# Patient Record
Sex: Male | Born: 1990 | Race: Black or African American | Hispanic: No | Marital: Single | Smoking: Current every day smoker
Health system: Southern US, Community
[De-identification: ages and names within clinical notes are randomized; demographics above are authoritative.]

## PROBLEM LIST (undated history)

## (undated) DIAGNOSIS — F819 Developmental disorder of scholastic skills, unspecified: Secondary | ICD-10-CM

## (undated) DIAGNOSIS — F32A Depression, unspecified: Secondary | ICD-10-CM

## (undated) DIAGNOSIS — F319 Bipolar disorder, unspecified: Secondary | ICD-10-CM

## (undated) DIAGNOSIS — R569 Unspecified convulsions: Secondary | ICD-10-CM

## (undated) DIAGNOSIS — I10 Essential (primary) hypertension: Secondary | ICD-10-CM

## (undated) DIAGNOSIS — R011 Cardiac murmur, unspecified: Secondary | ICD-10-CM

## (undated) DIAGNOSIS — F909 Attention-deficit hyperactivity disorder, unspecified type: Secondary | ICD-10-CM

## (undated) DIAGNOSIS — F329 Major depressive disorder, single episode, unspecified: Secondary | ICD-10-CM

## (undated) HISTORY — PX: HIP FRACTURE SURGERY: SHX118

---
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
## 2010-01-06 ENCOUNTER — Emergency Department (HOSPITAL_COMMUNITY): Admission: EM | Admit: 2010-01-06 | Discharge: 2010-01-07 | Payer: Self-pay | Admitting: Emergency Medicine

## 2010-06-07 ENCOUNTER — Inpatient Hospital Stay (INDEPENDENT_AMBULATORY_CARE_PROVIDER_SITE_OTHER)
Admission: RE | Admit: 2010-06-07 | Discharge: 2010-06-07 | Disposition: A | Discharge status: Home / Self Care | Payer: Self-pay | Source: Ambulatory Visit | Attending: Emergency Medicine | Admitting: Emergency Medicine

## 2010-06-07 DIAGNOSIS — J029 Acute pharyngitis, unspecified: Secondary | ICD-10-CM

## 2010-06-07 DIAGNOSIS — J40 Bronchitis, not specified as acute or chronic: Secondary | ICD-10-CM

## 2010-06-07 LAB — DIFFERENTIAL
Basophils Absolute: 0 10*3/uL (ref 0.0–0.1)
Basophils Relative: 0 % (ref 0–1)
Eosinophils Absolute: 0 10*3/uL (ref 0.0–0.7)
Eosinophils Relative: 0 % (ref 0–5)
Lymphocytes Relative: 10 % — ABNORMAL LOW (ref 12–46)
Lymphs Abs: 1.7 10*3/uL (ref 0.7–4.0)
Monocytes Absolute: 1.5 10*3/uL — ABNORMAL HIGH (ref 0.1–1.0)
Monocytes Relative: 9 % (ref 3–12)
Neutro Abs: 13.1 10*3/uL — ABNORMAL HIGH (ref 1.7–7.7)
Neutrophils Relative %: 81 % — ABNORMAL HIGH (ref 43–77)

## 2010-06-07 LAB — POCT I-STAT, CHEM 8
Chloride: 99 mEq/L (ref 96–112)
HCT: 46 % (ref 39.0–52.0)
Potassium: 3.6 mEq/L (ref 3.5–5.1)
Sodium: 134 mEq/L — ABNORMAL LOW (ref 135–145)

## 2010-06-07 LAB — POCT URINALYSIS DIP (DEVICE)
Glucose, UA: NEGATIVE mg/dL
Nitrite: NEGATIVE

## 2010-06-07 LAB — CBC
HCT: 40.8 % (ref 39.0–52.0)
Hemoglobin: 14 g/dL (ref 13.0–17.0)
MCH: 26.8 pg (ref 26.0–34.0)
MCHC: 34.3 g/dL (ref 30.0–36.0)
MCV: 78 fL (ref 78.0–100.0)
Platelets: 276 10*3/uL (ref 150–400)
RBC: 5.23 MIL/uL (ref 4.22–5.81)
RDW: 13.8 % (ref 11.5–15.5)
WBC: 16.3 10*3/uL — ABNORMAL HIGH (ref 4.0–10.5)

## 2010-06-07 LAB — POCT RAPID STREP A (OFFICE): Streptococcus, Group A Screen (Direct): NEGATIVE

## 2010-06-16 LAB — RAPID STREP SCREEN (MED CTR MEBANE ONLY): Streptococcus, Group A Screen (Direct): NEGATIVE

## 2011-08-15 ENCOUNTER — Encounter (HOSPITAL_COMMUNITY): Payer: Self-pay

## 2011-08-15 ENCOUNTER — Emergency Department (HOSPITAL_COMMUNITY)
Admission: EM | Admit: 2011-08-15 | Discharge: 2011-08-16 | Payer: Self-pay | Attending: Emergency Medicine | Admitting: Emergency Medicine

## 2011-08-15 ENCOUNTER — Emergency Department (HOSPITAL_COMMUNITY): Payer: Self-pay

## 2011-08-15 DIAGNOSIS — S0003XA Contusion of scalp, initial encounter: Secondary | ICD-10-CM | POA: Insufficient documentation

## 2011-08-15 DIAGNOSIS — J320 Chronic maxillary sinusitis: Secondary | ICD-10-CM | POA: Insufficient documentation

## 2011-08-15 DIAGNOSIS — F17200 Nicotine dependence, unspecified, uncomplicated: Secondary | ICD-10-CM | POA: Insufficient documentation

## 2011-08-15 DIAGNOSIS — R296 Repeated falls: Secondary | ICD-10-CM | POA: Insufficient documentation

## 2011-08-15 DIAGNOSIS — F445 Conversion disorder with seizures or convulsions: Secondary | ICD-10-CM

## 2011-08-15 DIAGNOSIS — Z79899 Other long term (current) drug therapy: Secondary | ICD-10-CM | POA: Insufficient documentation

## 2011-08-15 DIAGNOSIS — J322 Chronic ethmoidal sinusitis: Secondary | ICD-10-CM | POA: Insufficient documentation

## 2011-08-15 DIAGNOSIS — R569 Unspecified convulsions: Secondary | ICD-10-CM | POA: Insufficient documentation

## 2011-08-15 HISTORY — DX: Attention-deficit hyperactivity disorder, unspecified type: F90.9

## 2011-08-15 HISTORY — DX: Unspecified convulsions: R56.9

## 2011-08-15 HISTORY — DX: Developmental disorder of scholastic skills, unspecified: F81.9

## 2011-08-15 HISTORY — DX: Bipolar disorder, unspecified: F31.9

## 2011-08-15 HISTORY — DX: Cardiac murmur, unspecified: R01.1

## 2011-08-15 HISTORY — DX: Essential (primary) hypertension: I10

## 2011-08-15 HISTORY — DX: Depression, unspecified: F32.A

## 2011-08-15 HISTORY — DX: Major depressive disorder, single episode, unspecified: F32.9

## 2011-08-15 LAB — RAPID URINE DRUG SCREEN, HOSP PERFORMED
Amphetamines: NOT DETECTED
Benzodiazepines: NOT DETECTED
Opiates: NOT DETECTED

## 2011-08-15 LAB — POCT I-STAT, CHEM 8
BUN: 6 mg/dL (ref 6–23)
Creatinine, Ser: 0.9 mg/dL (ref 0.50–1.35)
Glucose, Bld: 84 mg/dL (ref 70–99)
Hemoglobin: 17.3 g/dL — ABNORMAL HIGH (ref 13.0–17.0)
Potassium: 3.6 mEq/L (ref 3.5–5.1)

## 2011-08-15 LAB — PHENYTOIN LEVEL, TOTAL: Phenytoin Lvl: <2.5 ug/mL — ABNORMAL LOW (ref 10.0–20.0)

## 2011-08-15 MED ORDER — LORAZEPAM 2 MG/ML IJ SOLN
INTRAMUSCULAR | Status: AC
  Administered 2011-08-15: 2 mg via INTRAVENOUS
  Filled 2011-08-15: qty 1

## 2011-08-15 MED ORDER — LORAZEPAM 2 MG/ML IJ SOLN
INTRAMUSCULAR | Status: AC
  Filled 2011-08-15: qty 2

## 2011-08-15 MED ORDER — SODIUM CHLORIDE 0.9 % IV SOLN
INTRAVENOUS | Status: DC
  Administered 2011-08-15 (×2): via INTRAVENOUS

## 2011-08-15 MED ORDER — LORAZEPAM 2 MG/ML IJ SOLN
INTRAMUSCULAR | Status: AC
  Filled 2011-08-15: qty 1

## 2011-08-15 MED ORDER — LORAZEPAM 2 MG/ML IJ SOLN
INTRAMUSCULAR | Status: AC
  Administered 2011-08-15: 22:00:00
  Filled 2011-08-15: qty 1

## 2011-08-15 MED ORDER — SODIUM CHLORIDE 0.9 % IV SOLN
20.0000 mg/kg | Freq: Once | INTRAVENOUS | Status: AC
  Administered 2011-08-16: 2160 mg via INTRAVENOUS
  Filled 2011-08-15: qty 43.2

## 2011-08-15 MED ORDER — LORAZEPAM 2 MG/ML IJ SOLN
2.0000 mg | Freq: Once | INTRAMUSCULAR | Status: AC
  Administered 2011-08-15: 22:00:00 via INTRAVENOUS
  Administered 2011-08-15 (×2): 2 mg via INTRAVENOUS

## 2011-08-15 NOTE — ED Notes (Signed)
Pt back from CT and having seizure.  Ativan given IV.  Pt arousing at this time.

## 2011-08-15 NOTE — Progress Notes (Addendum)
Patient ID: Dustin Fleming, male   DOB: Jan 17, 1991, 21 y.o.   MRN: [id number] NEUROLOGY GENERAL CONSULT NOTE  Referring Physician:  Rubin Payor  HPI:  Mr. Dustin Fleming is a 21 y.o. male with history of childhood seizures previous followed by Dr. Sharene Skeans who presents to the ED from jail with recurrent seizure-like episodes today. Patient reports he began having staring spells at 21 y/o and was initially on Depakote and Lamictal for several years. He followed locally with Dr. Sharene Skeans. In 10/2010, he was incarcerated and switched to Dilantin because he says the jail didn't have his other meds. Up to this point, he was having about 1 seizure per year which he says had become GTC episodes. After switching to Dilantin, he was released from jail around December and was no longer able to afford his meds for several months. He says he was having a few seizures per month off medication. He went back to jail yesterday. Today, he has had 7-8 episodes of generalized thrashing at jail and received 4mg  IV ativan and 4mg  IV versed. Here he has had 5 episodes and received multiple doses of ativan. I witnessed one of these spells. He complained of smelling rain before hand and then began thrashing in the bed, banging his head into his pillow and throwing his arms up and down. This lasted about 30 seconds. He then lay with his eyes open for about 20 seconds and then returned to baseline and began talking with me like normal. CT head was negative. Dilantin level was undetectable.   Past Medical History  Diagnosis Date  . Seizures   . Bipolar 1 disorder   . Depression   . Hypertension   . ADHD (attention deficit hyperactivity disorder)   . Learning disability   . Murmur     Past Surgical History  Procedure Date  . Hip fracture surgery     bilateral   Allergies  Allergen Reactions  . Dust Mite Extract   . Mold Extract (Trichophyton)     (Not in a hospital admission) History reviewed. No pertinent family  history. Social Hx: Smokes occasionally. Denies EtOH. UDS positive for marijuana.  Review of Systems:  A ten system review of systems was obtained and was negative except as stated above.   Physical Exam: BP 137/75  Pulse 96  Temp 98.8 F (37.1 C)  Resp 32  SpO2 100% GENERAL:   Well nourished, well hydrated, no acute distress.   CARDIOVASCULAR:   Regular rate and rhythm, no thrills or palpable murmurs, S1, S2, no murmur, no rubs or gallops.      Carotid arteries: No carotid bruits.   RESPIRATORY:  Clear to auscultation bilaterally, no wheezes, rhonci or rales  ABDOMEN:   Soft, non-tender, non-distended, bowel sounds present, no rebound or guarding  EXTREMITIES:  No rashes or lesions     No peripheral edema, cyanosis, or clubbing   MENTAL STATUS EXAM:    Orientation:  Alert and oriented to person, place and time.      Memory:  Cooperative, follows commands well.  Recent and remote memory normal.      Attention, concentration:  Attention span and concentration are normal.      Language:  Speech is clear and language is normal.      Fund of knowledge:  Aware of current events, vocabulary appropriate for patient age.   CRANIAL NERVES:     CN 2 (Optic):  Visual fields intact to confrontation, funduscopic examination without optic disk  pallor or edema, retinal vessels are normal.      CN 3,4,6 (EOM):  Pupils equal and reactive to light and near full eye movement without nystagmus.      CN 5 (Trigeminal):  Facial sensation is normal, no weakness of masticatory muscles.      CN 7 (Facial):  No facial weakness or asymmetry.      CN 8 (Auditory):  Auditory acuity grossly normal.      CN 9,10 (Glossophar):  The uvula is midline, the palate elevates symmetrically.      CN 11 (spinal access):  Normal sternocleidomastoid and trapezius strength.      CN 12 (Hypoglossal):  The tongue is midline. No atrophy or fasciculations.   MOTOR:    Neck flexors   (R): 5  (L): 5        Neck  extensors  (R): 5  (L): 5     Deltoids:            (R): 5  (L): 5      Biceps:                       (R): 5  (L): 5      Triceps:                       (R): 5  (L): 5      Wrist Extensors:         (R): 5  (L): 5      Wrist Flexors:      (R): 5  (L): 5      Flexor Pollicis Longus:      (R): 5  (L): 5      Adductor Digiti Minimi:       (R): 5  (L): 5      Abductor Pollicis Brevis:   (R): 5  (L): 5      Hip Flexors:                       (R): 5  (L): 5      Quadriceps:                       (R): 5  (L): 5      Hamstrings:                       (R): 5  (L): 5      Tibialis Anterior:                 (R): 5  (L): 5      Medial Gastrocnemius:     (R): 5  (L): 5      Extensor Hallucis              (R): 5  (L): 5  Muscle Tone: Tone and muscle bulk are normal in the upper and lower extremities.  There are no fasciculations.   REFLEXES:     Triceps:                 (R): 2+  (L): 2+      Biceps:                  (R): 2+  (L): 2+      Brachioradialis:     (R): 2+  (L): 2+      Patellar:                 (  R): 2+  (L): 2+      Achilles:                 (R): 2+  (L): 2+      Hoffman:    (R): absent  (L): absent      Babinski:    (R): absent  (L): absent   COORDINATION:     Intact finger-to-nose, heel-to-shin, and rapid alternating movements. No tremor.   SENSATION:     Intact to light touch, vibration, pinprick.    GAIT:     Unable to assess as patient is shackled to the bed.  Diagnostic Studies:   Ct Head Wo Contrast  08/15/2011  *RADIOLOGY REPORT*  Clinical Data: Seizures.  Fell.  Right supraorbital abrasion.  CT HEAD WITHOUT CONTRAST  Technique:  Contiguous axial images were obtained from the base of the skull through the vertex without contrast.  Comparison: None.  Findings: Small right frontal scalp hematoma.  No skull fracture, intracranial hemorrhage or paranasal sinus air-fluid levels. Normal appearing cerebral hemispheres and posterior fossa structures.  Normal size and position of the  ventricles.  No intracranial hemorrhage, mass lesion or CT evidence of acute infarction.  Sphenoid and right maxillary sinus retention cysts. Mild bilateral ethmoid and left maxillary sinus mucosal thickening. Small right ethmoid sinus osteoma.  IMPRESSION:  1.  No skull fracture or intracranial hemorrhage. 2.  Mild chronic bilateral ethmoid and left maxillary sinusitis. 3.  Small right frontal scalp hematoma.  Original Report Authenticated By: Darrol Angel, M.D.   Ct Maxillofacial Wo Cm  08/15/2011  *RADIOLOGY REPORT*  Clinical Data: Right supraorbital hematoma following a fall. Seizures.  CT MAXILLOFACIAL WITHOUT CONTRAST  Technique:  Multidetector CT imaging of the maxillofacial structures was performed. Multiplanar CT image reconstructions were also generated.  Comparison: Head CT obtained at the same time.  Findings: The superior portions of the orbits are not included due to the fact that the patient moved during the examination.  These are included on the head CT.  Previously described paranasal sinus mucosal thickening and retention cysts.  No fractures or paranasal sinus air-fluid levels are seen.  Unremarkable mid and upper cervical spine.  IMPRESSION:  1.  No fracture. 2.  Previously described chronic sinusitis.  Original Report Authenticated By: Darrol Angel, M.D.    Labs on Admission:   [facility] 08/15/11 2031  NA 141  K 3.6  CL 108  CO2 --  GLUCOSE 84  BUN 6  CREATININE 0.90  CALCIUM --  MG --  PHOS --   No results found for this basename: AST:2,ALT:2,ALKPHOS:2,BILITOT:2,PROT:2,ALBUMIN:2 in the last 72 hours No results found for this basename: LIPASE:2,AMYLASE:2 in the last 72 hours  Basename 08/15/11 2031  WBC --  NEUTROABS --  HGB 17.3*  HCT 51.0  MCV --  PLT --   No results found for this basename: CKTOTAL:3,CKMB:3,CKMBINDEX:3,TROPONINI:3 in the last 72 hours No results found for this basename: TSH,T4TOTAL,FREET3,T3FREE,THYROIDAB in the last 72 hours No results  found for this basename: VITAMINB12:2,FOLATE:2,FERRITIN:2,TIBC:2,IRON:2,RETICCTPCT:2 in the last 72 hours  Assessment:  21y/o man with reported childhood history of epilepsy previously managed on Depakote/Lamictal and then switched to Dilantin. Episodes I witnessed in the ED tonight are not consistent with epileptic seizures and are better described as nonepileptic spells. His thrashing movements, head bobbing, and rapid return to baseline make generalized seizure unlikely.  Plan: -Would reload his dilantin at 20mg /kg or fosphenytoin at 20PE/kg and restart his maintenance dosing of dilantin. If he  cannot recall dose, consider 300mg  daily and have prison medical facility check daily levels until stable. -Would avoid further benzo doses as this is not ictal activity. -Would recommend further follow up with Dr. Sharene Skeans to determine if any AED is necessary. -No further neurologic workup recommended.  Thank you for this consultation. Please page me with any further questions if needed.  Kipp Laurence, MD [facility] [phone] 08/15/2011, 10:49 PM

## 2011-08-15 NOTE — ED Notes (Signed)
PA at bedside.

## 2011-08-15 NOTE — ED Notes (Signed)
Pt thrashing in bed again.  Movements purposeful post movement.  No change from previous thrashing.  Per Neuro, pseudoseizure.  Do not push additional ativan at this time.

## 2011-08-15 NOTE — ED Notes (Signed)
[facility].  Per EMS, pt had 5 seizures.  Jail does not have pt normal meds and has substituted his dilantin.  Pt seizure in route.  Pt seizure on arrival.  Versed 5 given in route.  Ativan given at jail.  IV unable to obtain in route.

## 2011-08-15 NOTE — ED Notes (Signed)
Notified MD pt actively seizing again.

## 2011-08-15 NOTE — ED Notes (Signed)
Pt actively siezing again.  MD notified.

## 2011-08-15 NOTE — ED Notes (Signed)
Bed:WA19<BR> Expected date:<BR> Expected time:<BR> Means of arrival:<BR> Comments:<BR> EMS

## 2011-08-15 NOTE — ED Provider Notes (Signed)
History     CSN: [id number]  Arrival date & time 08/15/11  2010   First MD Initiated Contact with Patient 08/15/11 2010      Chief Complaint  Patient presents with  . Seizures    (Consider location/radiation/quality/duration/timing/severity/associated sxs/prior treatment) The history is provided by the patient, medical records and the police.   patient came from jail after having a seizure. He reportedly had 4 seizures before arriving here. He was given 4 of Ativan and 4 of Versed. Patient has a seizure history. He states that he was previously on Lamictal because I went on affected his weight. He states that the last time he was in jail he switched him over to Dilantin because they couldn't give him the Lamictal he needed. After he was discharged from jail he was not able to get it filled. He states his been off it for about a month and was just restarted. He states he can tell when the seizures come on sometimes also just come on. Then eating a chicken okay. He had hit his head with the seizures.  Past Medical History  Diagnosis Date  . Seizures   . Bipolar 1 disorder   . Depression   . Hypertension   . ADHD (attention deficit hyperactivity disorder)   . Learning disability   . Murmur     Past Surgical History  Procedure Date  . Hip fracture surgery     bilateral    History reviewed. No pertinent family history.  History  Substance Use Topics  . Smoking status: Current Everyday Smoker -- 1.0 packs/day  . Smokeless tobacco: Not on file  . Alcohol Use: Yes      Review of Systems  Constitutional: Negative for activity change and appetite change.  HENT: Negative for neck stiffness.   Eyes: Negative for pain.  Respiratory: Negative for chest tightness and shortness of breath.   Cardiovascular: Negative for chest pain and leg swelling.  Gastrointestinal: Negative for nausea, vomiting, abdominal pain and diarrhea.  Genitourinary: Negative for flank pain.    Musculoskeletal: Negative for back pain.  Skin: Negative for rash.  Neurological: Positive for seizures. Negative for weakness, numbness and headaches.  Psychiatric/Behavioral: Negative for behavioral problems.    Allergies  Dust mite extract and Mold extract  Home Medications   Current Outpatient Rx  Name Route Sig Dispense Refill  . PHENYTOIN SODIUM EXTENDED 100 MG PO CAPS Oral Take 100 mg by mouth 3 (three) times daily.      BP 137/75  Pulse 96  Temp 98.8 F (37.1 C)  Resp 32  Ht 6\' 5"  (1.956 m)  Wt 300 lb (136.079 kg)  BMI 35.57 kg/m2  SpO2 100%  Physical Exam  Nursing note and vitals reviewed. Constitutional: He is oriented to person, place, and time. He appears well-developed and well-nourished.  HENT:  Head: Normocephalic.       Hematoma right forehead bridge of nose and posterior scalp. No lacerations.  Eyes: EOM are normal. Pupils are equal, round, and reactive to light.  Neck: Normal range of motion. Neck supple.  Cardiovascular: Normal rate, regular rhythm and normal heart sounds.   No murmur heard. Pulmonary/Chest: Effort normal and breath sounds normal.  Abdominal: Soft. Bowel sounds are normal. He exhibits no distension and no mass. There is no tenderness. There is no rebound and no guarding.  Musculoskeletal: Normal range of motion. He exhibits no edema.  Neurological: He is alert and oriented to person, place, and time. No cranial nerve  deficit.  Skin: Skin is warm and dry.  Psychiatric: He has a normal mood and affect.    ED Course  Procedures (including critical care time)  Labs Reviewed  PHENYTOIN LEVEL, TOTAL - Abnormal; Notable for the following:    Phenytoin Lvl <2.5 (*)    All other components within normal limits  URINE RAPID DRUG SCREEN (HOSP PERFORMED) - Abnormal; Notable for the following:    Tetrahydrocannabinol POSITIVE (*)    All other components within normal limits  POCT I-STAT, CHEM 8 - Abnormal; Notable for the following:     Hemoglobin 17.3 (*)    All other components within normal limits   Ct Head Wo Contrast  08/15/2011  *RADIOLOGY REPORT*  Clinical Data: Seizures.  Fell.  Right supraorbital abrasion.  CT HEAD WITHOUT CONTRAST  Technique:  Contiguous axial images were obtained from the base of the skull through the vertex without contrast.  Comparison: None.  Findings: Small right frontal scalp hematoma.  No skull fracture, intracranial hemorrhage or paranasal sinus air-fluid levels. Normal appearing cerebral hemispheres and posterior fossa structures.  Normal size and position of the ventricles.  No intracranial hemorrhage, mass lesion or CT evidence of acute infarction.  Sphenoid and right maxillary sinus retention cysts. Mild bilateral ethmoid and left maxillary sinus mucosal thickening. Small right ethmoid sinus osteoma.  IMPRESSION:  1.  No skull fracture or intracranial hemorrhage. 2.  Mild chronic bilateral ethmoid and left maxillary sinusitis. 3.  Small right frontal scalp hematoma.  Original Report Authenticated By: Darrol Angel, M.D.   Ct Maxillofacial Wo Cm  08/15/2011  *RADIOLOGY REPORT*  Clinical Data: Right supraorbital hematoma following a fall. Seizures.  CT MAXILLOFACIAL WITHOUT CONTRAST  Technique:  Multidetector CT imaging of the maxillofacial structures was performed. Multiplanar CT image reconstructions were also generated.  Comparison: Head CT obtained at the same time.  Findings: The superior portions of the orbits are not included due to the fact that the patient moved during the examination.  These are included on the head CT.  Previously described paranasal sinus mucosal thickening and retention cysts.  No fractures or paranasal sinus air-fluid levels are seen.  Unremarkable mid and upper cervical spine.  IMPRESSION:  1.  No fracture. 2.  Previously described chronic sinusitis.  Original Report Authenticated By: Darrol Angel, M.D.     1. Pseudoseizure       MDM  Patient with reported  seizures. His been off his Dilantin. He was placed in the jail today. He then started having seizures. Patient's had several of these episodes in the ER. He was empirically given Ativan by the nursing staff under protocol. These were witnessed by neurology and it to the ER attendings. He has had purposeful movements during the episodes. Is not post ictal after the events. 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 Patient does have a history of seizures and is subtherapeutic on Dilantin. He will be loaded with Dilantin by IV and will likely be discharged back to jail.        Juliet Rude. Rubin Payor, MD 08/16/11 1478

## 2011-08-15 NOTE — ED Notes (Signed)
Pt thrashing again.  No change in previous pattern.

## 2011-08-16 DIAGNOSIS — R569 Unspecified convulsions: Secondary | ICD-10-CM

## 2011-08-16 MED ORDER — PHENYTOIN SODIUM EXTENDED 100 MG PO CAPS: 100.0000 mg | ORAL_CAPSULE | Freq: Three times a day (TID) | ORAL | Status: DC

## 2011-08-16 NOTE — ED Notes (Signed)
Pt in 4 point cuffs with officer

## 2011-08-16 NOTE — ED Notes (Signed)
Spoke with officer at bedside regarding d/c instructions and disposition.  Officer aware of dx and tx.  Pt to go back to jail.

## 2011-08-16 NOTE — Discharge Instructions (Signed)
This shaking does not appear to be cause by your seizure disorder. You have been loaded with dilantin. Follow with Dr Sharene Skeans.  Nonepileptic Seizures Nonepileptic seizures look like true epileptic seizures. The difference between nonepileptic seizures and real seizures is that real seizures are caused by an electrical abnormality in the brain. Nonepileptic seizures have no medical cause. Nonepileptic seizures may look real to an untrained person. A neurologist can usually tell the difference between a real seizure and a nonepileptic seizure. Nonepileptic seizures may also be called pseudoseizures. They are more frequent in women. CAUSES  In general, the patient is unaware that the movements are not real seizures. This disorder is caused by stress or emotional trauma. Patients often feel badly. Patients are sometimes accused of causing the seizure-like movements when they are not aware that their symptoms are due to stress. The nonepileptic seizures are real and frightening to patients with this disorder. Sometimes, nonepileptic seizures may be due to a person faking the symptoms to get something he or she wants. DIAGNOSIS  The diagnosis requires the patient to be continuously monitored by:  EEG (electroencephalogram).   Video camera.  After an episode, the patient is asked about their awareness, memory, and feelings during the seizure. The family, if present, also discusses what they see. The EEG and clinical information allows the neurologist to determine if the seizures are related to abnormal electrical activity in the brain. TREATMENT  Medicines may be stopped if the patient has been treated for a true seizure disorder. Patient counseling is usually begun. Depression and anxiety, if present, are treated. Counseling helps to resolve stress.  Document Released: 04/17/2005 Document Revised: 02/19/2011 Document Reviewed: 09/13/2008 [facility] Patient Information 2012 [place], [place].

## 2012-08-16 ENCOUNTER — Encounter (HOSPITAL_COMMUNITY): Payer: Self-pay | Admitting: Emergency Medicine

## 2012-08-16 ENCOUNTER — Emergency Department (HOSPITAL_COMMUNITY)
Admission: EM | Admit: 2012-08-16 | Discharge: 2012-08-17 | Disposition: A | Discharge status: Home / Self Care | Payer: BC Managed Care – PPO | Attending: Emergency Medicine | Admitting: Emergency Medicine

## 2012-08-16 DIAGNOSIS — IMO0001 Reserved for inherently not codable concepts without codable children: Secondary | ICD-10-CM | POA: Insufficient documentation

## 2012-08-16 DIAGNOSIS — F17200 Nicotine dependence, unspecified, uncomplicated: Secondary | ICD-10-CM | POA: Insufficient documentation

## 2012-08-16 DIAGNOSIS — F8189 Other developmental disorders of scholastic skills: Secondary | ICD-10-CM | POA: Insufficient documentation

## 2012-08-16 DIAGNOSIS — Z8679 Personal history of other diseases of the circulatory system: Secondary | ICD-10-CM | POA: Insufficient documentation

## 2012-08-16 DIAGNOSIS — F3289 Other specified depressive episodes: Secondary | ICD-10-CM | POA: Insufficient documentation

## 2012-08-16 DIAGNOSIS — J019 Acute sinusitis, unspecified: Secondary | ICD-10-CM | POA: Insufficient documentation

## 2012-08-16 DIAGNOSIS — F329 Major depressive disorder, single episode, unspecified: Secondary | ICD-10-CM | POA: Insufficient documentation

## 2012-08-16 DIAGNOSIS — F909 Attention-deficit hyperactivity disorder, unspecified type: Secondary | ICD-10-CM | POA: Insufficient documentation

## 2012-08-16 DIAGNOSIS — G40909 Epilepsy, unspecified, not intractable, without status epilepticus: Secondary | ICD-10-CM | POA: Insufficient documentation

## 2012-08-16 DIAGNOSIS — Z87898 Personal history of other specified conditions: Secondary | ICD-10-CM

## 2012-08-16 DIAGNOSIS — R1013 Epigastric pain: Secondary | ICD-10-CM | POA: Insufficient documentation

## 2012-08-16 DIAGNOSIS — R42 Dizziness and giddiness: Secondary | ICD-10-CM | POA: Insufficient documentation

## 2012-08-16 DIAGNOSIS — H53149 Visual discomfort, unspecified: Secondary | ICD-10-CM | POA: Insufficient documentation

## 2012-08-16 DIAGNOSIS — R51 Headache: Secondary | ICD-10-CM | POA: Insufficient documentation

## 2012-08-16 DIAGNOSIS — I10 Essential (primary) hypertension: Secondary | ICD-10-CM | POA: Insufficient documentation

## 2012-08-16 DIAGNOSIS — IMO0002 Reserved for concepts with insufficient information to code with codable children: Secondary | ICD-10-CM | POA: Insufficient documentation

## 2012-08-16 DIAGNOSIS — Z79899 Other long term (current) drug therapy: Secondary | ICD-10-CM | POA: Insufficient documentation

## 2012-08-16 DIAGNOSIS — F319 Bipolar disorder, unspecified: Secondary | ICD-10-CM | POA: Insufficient documentation

## 2012-08-16 LAB — COMPREHENSIVE METABOLIC PANEL
ALT: 10 U/L (ref 0–53)
Alkaline Phosphatase: 92 U/L (ref 39–117)
CO2: 26 mEq/L (ref 19–32)
Calcium: 9.2 mg/dL (ref 8.4–10.5)
GFR calc Af Amer: >90 mL/min (ref >90)
GFR calc non Af Amer: >90 mL/min (ref >90)
Glucose, Bld: 90 mg/dL (ref 70–99)
Potassium: 3.5 mEq/L (ref 3.5–5.1)
Sodium: 136 mEq/L (ref 135–145)
Total Bilirubin: 1 mg/dL (ref 0.3–1.2)

## 2012-08-16 LAB — CBC WITH DIFFERENTIAL/PLATELET
Eosinophils Relative: 1 % (ref 0–5)
HCT: 41.5 % (ref 39.0–52.0)
Lymphocytes Relative: 10 % — ABNORMAL LOW (ref 12–46)
Lymphs Abs: 1.4 10*3/uL (ref 0.7–4.0)
MCV: 78.2 fL (ref 78.0–100.0)
Platelets: 201 10*3/uL (ref 150–400)
RBC: 5.31 MIL/uL (ref 4.22–5.81)
WBC: 13.9 10*3/uL — ABNORMAL HIGH (ref 4.0–10.5)

## 2012-08-16 LAB — URINALYSIS, ROUTINE W REFLEX MICROSCOPIC
Bilirubin Urine: NEGATIVE
Ketones, ur: NEGATIVE mg/dL
Nitrite: NEGATIVE
Protein, ur: NEGATIVE mg/dL
Urobilinogen, UA: 1 mg/dL (ref 0.0–1.0)

## 2012-08-16 LAB — RAPID URINE DRUG SCREEN, HOSP PERFORMED
Barbiturates: NOT DETECTED
Tetrahydrocannabinol: NOT DETECTED

## 2012-08-16 MED ORDER — ACETAMINOPHEN 325 MG PO TABS
325.0000 mg | ORAL_TABLET | Freq: Once | ORAL | Status: AC
  Administered 2012-08-16: 325 mg via ORAL
  Filled 2012-08-16: qty 1

## 2012-08-16 MED ORDER — ACETAMINOPHEN 325 MG PO TABS
325.0000 mg | ORAL_TABLET | Freq: Once | ORAL | Status: AC
  Administered 2012-08-17: 325 mg via ORAL
  Filled 2012-08-16: qty 1

## 2012-08-16 MED ORDER — DIPHENHYDRAMINE HCL 50 MG/ML IJ SOLN
50.0000 mg | Freq: Once | INTRAMUSCULAR | Status: AC
  Administered 2012-08-16: 50 mg via INTRAMUSCULAR

## 2012-08-16 MED ORDER — METOCLOPRAMIDE HCL 5 MG/ML IJ SOLN
10.0000 mg | Freq: Once | INTRAMUSCULAR | Status: AC
  Administered 2012-08-16: 10 mg via INTRAMUSCULAR

## 2012-08-16 MED ORDER — AMOXICILLIN-POT CLAVULANATE 875-125 MG PO TABS: 1.0000 | ORAL_TABLET | Freq: Two times a day (BID) | ORAL | Status: DC

## 2012-08-16 MED ORDER — SODIUM CHLORIDE 0.9 % IV BOLUS (SEPSIS): 1000.0000 mL | Freq: Once | INTRAVENOUS | Status: DC

## 2012-08-16 MED ORDER — DIPHENHYDRAMINE HCL 50 MG/ML IJ SOLN
25.0000 mg | Freq: Once | INTRAMUSCULAR | Status: DC
  Filled 2012-08-16: qty 1

## 2012-08-16 MED ORDER — KETOROLAC TROMETHAMINE 60 MG/2ML IM SOLN
60.0000 mg | Freq: Once | INTRAMUSCULAR | Status: AC
  Administered 2012-08-16: 60 mg via INTRAMUSCULAR
  Filled 2012-08-16: qty 2

## 2012-08-16 MED ORDER — METOCLOPRAMIDE HCL 5 MG/ML IJ SOLN
10.0000 mg | Freq: Once | INTRAMUSCULAR | Status: DC
  Filled 2012-08-16: qty 2

## 2012-08-16 MED ORDER — MOMETASONE FUROATE 50 MCG/ACT NA SUSP: 2.0000 | Freq: Every day | NASAL | Status: DC

## 2012-08-16 NOTE — ED Notes (Signed)
Per EMS: pt states he was released from prison Saturday and has not need taking his medications for about 1.5 months. States that he has been feeling dizzy and has been having his aura but has not have a seizure.

## 2012-08-16 NOTE — ED Notes (Signed)
WUJ:[id number]<JY> Expected date:<BR> Expected time:<BR> Means of arrival:<BR> Comments:<BR> ems- pt off seizure and HTN meds due to being in prison. Febrile and dizzy

## 2012-08-16 NOTE — ED Notes (Signed)
Not able to gain IV access for pt. IV RN paged.
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
## 2012-08-17 MED ORDER — SULFAMETHOXAZOLE-TRIMETHOPRIM 800-160 MG PO TABS: 1.0000 | ORAL_TABLET | Freq: Two times a day (BID) | ORAL | Status: DC

## 2012-08-17 MED ORDER — MOMETASONE FUROATE 50 MCG/ACT NA SUSP: 2.0000 | Freq: Every day | NASAL | Status: DC

## 2012-08-17 NOTE — ED Provider Notes (Signed)
Medical screening examination/treatment/procedure(s) were conducted as a shared visit with non-physician practitioner(s) and myself.  I personally evaluated the patient during the encounter  Sinus tenderness to percussion, +swollen nasal turbinates, history of URI symptoms. No neck stiffness, focal weakness, sensory or visual changes.   Clinically pt has sinusitis.   Loren Racer, MD 08/17/12 [phone]

## 2012-08-17 NOTE — ED Notes (Signed)
Pt d/c home with family d/c instruction done teach back complete. Will monitor.

## 2012-08-18 LAB — CULTURE, GROUP A STREP

## 2012-08-19 NOTE — ED Notes (Signed)
Post ED Visit - Positive Culture Follow-up  Culture report reviewed by antimicrobial stewardship pharmacist: []  Wes Dulaney, Pharm.D., BCPS []  Celedonio Miyamoto, Pharm.D., BCPS [x]  Georgina Pillion, Pharm.D., BCPS []  [place], [address].D., BCPS, AAHIVP []  Estella Husk, Pharm.D., BCPS, AAHIVP  Positive Group A strep culture Treated with Sulfa-trim- organism sensitive to the same and no further patient follow-up is required at this time.  Larena Sox 08/19/2012, 1:35 PM

## 2017-07-17 ENCOUNTER — Ambulatory Visit (INDEPENDENT_AMBULATORY_CARE_PROVIDER_SITE_OTHER): Payer: Self-pay

## 2017-07-17 ENCOUNTER — Ambulatory Visit (HOSPITAL_COMMUNITY)
Admission: EM | Admit: 2017-07-17 | Discharge: 2017-07-17 | Disposition: A | Discharge status: Home / Self Care | Payer: Self-pay | Attending: Family Medicine | Admitting: Family Medicine

## 2017-07-17 ENCOUNTER — Other Ambulatory Visit: Payer: Self-pay

## 2017-07-17 ENCOUNTER — Encounter (HOSPITAL_COMMUNITY): Payer: Self-pay | Admitting: Emergency Medicine

## 2017-07-17 DIAGNOSIS — R1012 Left upper quadrant pain: Secondary | ICD-10-CM

## 2017-07-17 DIAGNOSIS — R0781 Pleurodynia: Secondary | ICD-10-CM

## 2017-07-17 MED ORDER — MELOXICAM 15 MG PO TABS: 15.0000 mg | ORAL_TABLET | Freq: Every day | ORAL | 1 refills | Status: AC
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
## 2017-08-31 ENCOUNTER — Encounter (HOSPITAL_COMMUNITY): Payer: Self-pay

## 2017-08-31 ENCOUNTER — Emergency Department (HOSPITAL_COMMUNITY): Payer: Self-pay

## 2017-08-31 ENCOUNTER — Other Ambulatory Visit: Payer: Self-pay

## 2017-08-31 ENCOUNTER — Emergency Department (HOSPITAL_COMMUNITY)
Admission: EM | Admit: 2017-08-31 | Discharge: 2017-09-01 | Disposition: A | Discharge status: Home / Self Care | Payer: Self-pay | Attending: Emergency Medicine | Admitting: Emergency Medicine

## 2017-08-31 DIAGNOSIS — F17200 Nicotine dependence, unspecified, uncomplicated: Secondary | ICD-10-CM | POA: Insufficient documentation

## 2017-08-31 DIAGNOSIS — R079 Chest pain, unspecified: Secondary | ICD-10-CM | POA: Insufficient documentation

## 2017-08-31 DIAGNOSIS — R112 Nausea with vomiting, unspecified: Secondary | ICD-10-CM | POA: Insufficient documentation

## 2017-08-31 DIAGNOSIS — F1210 Cannabis abuse, uncomplicated: Secondary | ICD-10-CM | POA: Insufficient documentation

## 2017-08-31 DIAGNOSIS — R51 Headache: Secondary | ICD-10-CM | POA: Insufficient documentation

## 2017-08-31 DIAGNOSIS — R52 Pain, unspecified: Secondary | ICD-10-CM

## 2017-08-31 DIAGNOSIS — B349 Viral infection, unspecified: Secondary | ICD-10-CM | POA: Insufficient documentation

## 2017-08-31 DIAGNOSIS — R509 Fever, unspecified: Secondary | ICD-10-CM | POA: Insufficient documentation

## 2017-08-31 DIAGNOSIS — M7918 Myalgia, other site: Secondary | ICD-10-CM | POA: Insufficient documentation

## 2017-08-31 DIAGNOSIS — R05 Cough: Secondary | ICD-10-CM | POA: Insufficient documentation

## 2017-08-31 DIAGNOSIS — R300 Dysuria: Secondary | ICD-10-CM | POA: Insufficient documentation

## 2017-08-31 DIAGNOSIS — R197 Diarrhea, unspecified: Secondary | ICD-10-CM | POA: Insufficient documentation

## 2017-08-31 DIAGNOSIS — M791 Myalgia, unspecified site: Secondary | ICD-10-CM | POA: Insufficient documentation

## 2017-08-31 DIAGNOSIS — R059 Cough, unspecified: Secondary | ICD-10-CM

## 2017-08-31 DIAGNOSIS — I10 Essential (primary) hypertension: Secondary | ICD-10-CM | POA: Insufficient documentation

## 2017-08-31 LAB — CBC WITH DIFFERENTIAL/PLATELET
BASOS ABS: 0 10*3/uL (ref 0.0–0.1)
BASOS PCT: 0 %
EOS ABS: 0 10*3/uL (ref 0.0–0.7)
EOS PCT: 0 %
HCT: 43.6 % (ref 39.0–52.0)
Hemoglobin: 14.5 g/dL (ref 13.0–17.0)
Lymphocytes Relative: 10 %
Lymphs Abs: 1 10*3/uL (ref 0.7–4.0)
MCH: 27 pg (ref 26.0–34.0)
MCHC: 33.3 g/dL (ref 30.0–36.0)
MCV: 81.2 fL (ref 78.0–100.0)
Monocytes Absolute: 0.9 10*3/uL (ref 0.1–1.0)
Monocytes Relative: 9 %
Neutro Abs: 7.9 10*3/uL — ABNORMAL HIGH (ref 1.7–7.7)
Neutrophils Relative %: 81 %
PLATELETS: 213 10*3/uL (ref 150–400)
RBC: 5.37 MIL/uL (ref 4.22–5.81)
RDW: 14.6 % (ref 11.5–15.5)
WBC: 9.8 10*3/uL (ref 4.0–10.5)

## 2017-08-31 LAB — COMPREHENSIVE METABOLIC PANEL
ALT: 20 U/L (ref 17–63)
AST: 30 U/L (ref 15–41)
Albumin: 4.1 g/dL (ref 3.5–5.0)
Alkaline Phosphatase: 63 U/L (ref 38–126)
Anion gap: 10 (ref 5–15)
BUN: 8 mg/dL (ref 6–20)
CHLORIDE: 100 mmol/L — AB (ref 101–111)
CO2: 24 mmol/L (ref 22–32)
Calcium: 9 mg/dL (ref 8.9–10.3)
Creatinine, Ser: 0.97 mg/dL (ref 0.61–1.24)
GFR calc non Af Amer: >60 mL/min (ref >60)
Glucose, Bld: 95 mg/dL (ref 65–99)
Potassium: 3.8 mmol/L (ref 3.5–5.1)
SODIUM: 134 mmol/L — AB (ref 135–145)
Total Bilirubin: 1 mg/dL (ref 0.3–1.2)
Total Protein: 8.1 g/dL (ref 6.5–8.1)

## 2017-08-31 LAB — LIPASE, BLOOD: LIPASE: 22 U/L (ref 11–51)

## 2017-08-31 LAB — URINALYSIS, ROUTINE W REFLEX MICROSCOPIC
Bacteria, UA: NONE SEEN
Bilirubin Urine: NEGATIVE
Glucose, UA: NEGATIVE mg/dL
Ketones, ur: 5 mg/dL — AB
Leukocytes, UA: NEGATIVE
Nitrite: NEGATIVE
Protein, ur: NEGATIVE mg/dL
Specific Gravity, Urine: 1.027 (ref 1.005–1.030)
pH: 5 (ref 5.0–8.0)

## 2017-08-31 LAB — I-STAT CG4 LACTIC ACID, ED: Lactic Acid, Venous: 1.01 mmol/L (ref 0.5–1.9)

## 2017-08-31 MED ORDER — SODIUM CHLORIDE 0.9 % IV BOLUS
1000.0000 mL | Freq: Once | INTRAVENOUS | Status: AC
  Administered 2017-08-31: 1000 mL via INTRAVENOUS

## 2017-08-31 MED ORDER — ACETAMINOPHEN 325 MG PO TABS
650.0000 mg | ORAL_TABLET | Freq: Once | ORAL | Status: AC
  Administered 2017-08-31: 650 mg via ORAL
  Filled 2017-08-31: qty 2

## 2017-08-31 MED ORDER — ONDANSETRON HCL 4 MG/2ML IJ SOLN
4.0000 mg | Freq: Once | INTRAMUSCULAR | Status: AC
  Administered 2017-08-31: 4 mg via INTRAVENOUS
  Filled 2017-08-31: qty 2

## 2017-08-31 MED ORDER — ACETAMINOPHEN 325 MG PO TABS
650.0000 mg | ORAL_TABLET | Freq: Once | ORAL | Status: DC
  Filled 2017-08-31: qty 2
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
## 2017-08-31 NOTE — ED Triage Notes (Addendum)
Patient BIB EMS for N/V/D and a fever for 2 days, and also SOB for 2 days. Patient also complains of abdominal pain. He has a hx of hypertension, seizures, and asthma but is not on any medications currently. Patient was given albuterol treatment en route by EMS. Vitals BP 148/94 RR 22 Sp02 100 Temp 102

## 2017-09-01 MED ORDER — CYCLOBENZAPRINE HCL 10 MG PO TABS: 10.0000 mg | ORAL_TABLET | Freq: Two times a day (BID) | ORAL | 0 refills | Status: AC | PRN

## 2017-09-01 MED ORDER — CYCLOBENZAPRINE HCL 10 MG PO TABS
10.0000 mg | ORAL_TABLET | Freq: Once | ORAL | Status: AC
  Administered 2017-09-01: 10 mg via ORAL
  Filled 2017-09-01: qty 1

## 2017-09-01 MED ORDER — LAMOTRIGINE 25 MG PO TABS
25.0000 mg | ORAL_TABLET | Freq: Every day | ORAL | 0 refills | Status: AC
Start: 2017-09-01 — End: ?

## 2017-09-01 MED ORDER — IBUPROFEN 800 MG PO TABS
800.0000 mg | ORAL_TABLET | Freq: Once | ORAL | Status: AC
  Administered 2017-09-01: 800 mg via ORAL
  Filled 2017-09-01: qty 1

## 2017-09-01 NOTE — ED Notes (Signed)
Patient is asking for pain medication. Updated vital signs and IV fluids continue to infuse. Notified Dr. Rush Landmarkegeler and provided a set of vital signs. He reports he will come speak with patient.

## 2017-09-01 NOTE — Discharge Instructions (Signed)
Your work-up today showed no evidence of acute bacterial infection.  We suspect you have a viral infection similar to your sick contact.  Please stay hydrated.  With the muscle spasms we felt, a muscle relaxant prescription was provided.  Please follow-up with a primary care physician in the next several days for reassessment further management.  We also provided your prescription for your seizure medicine.  Please follow-up with your neurologist as well.  If any symptoms change or worsen, please return to the nearest emergency department.

## 2017-09-02 LAB — URINE CULTURE: Culture: NO GROWTH

## 2018-09-15 ENCOUNTER — Other Ambulatory Visit: Payer: Self-pay

## 2018-09-15 ENCOUNTER — Emergency Department: Admission: EM | Admit: 2018-09-15 | Discharge: 2018-09-15 | Discharge status: Against Medical Advice | Payer: Self-pay

## 2019-01-21 ENCOUNTER — Other Ambulatory Visit: Payer: Self-pay

## 2019-01-21 ENCOUNTER — Encounter: Payer: Self-pay | Admitting: Emergency Medicine

## 2019-01-21 ENCOUNTER — Emergency Department: Payer: Self-pay

## 2019-01-21 ENCOUNTER — Emergency Department
Admission: EM | Admit: 2019-01-21 | Discharge: 2019-01-21 | Disposition: A | Discharge status: Home / Self Care | Payer: Self-pay | Attending: Emergency Medicine | Admitting: Emergency Medicine

## 2019-01-21 DIAGNOSIS — F17200 Nicotine dependence, unspecified, uncomplicated: Secondary | ICD-10-CM | POA: Insufficient documentation

## 2019-01-21 DIAGNOSIS — I10 Essential (primary) hypertension: Secondary | ICD-10-CM | POA: Insufficient documentation

## 2019-01-21 DIAGNOSIS — Z79899 Other long term (current) drug therapy: Secondary | ICD-10-CM | POA: Insufficient documentation

## 2019-01-21 DIAGNOSIS — M545 Low back pain, unspecified: Secondary | ICD-10-CM

## 2019-01-21 DIAGNOSIS — G8929 Other chronic pain: Secondary | ICD-10-CM | POA: Insufficient documentation

## 2019-01-21 MED ORDER — OXYCODONE-ACETAMINOPHEN 7.5-325 MG PO TABS: 1.0000 | ORAL_TABLET | Freq: Four times a day (QID) | ORAL | 0 refills | Status: AC | PRN

## 2019-01-21 MED ORDER — NAPROXEN 500 MG PO TABS: 500.0000 mg | ORAL_TABLET | Freq: Two times a day (BID) | ORAL | Status: AC

## 2019-01-21 MED ORDER — CYCLOBENZAPRINE HCL 10 MG PO TABS: 10.0000 mg | ORAL_TABLET | Freq: Three times a day (TID) | ORAL | 0 refills | Status: AC | PRN

## 2019-01-21 NOTE — ED Notes (Signed)
Pt verbalized understanding of discharge instructions. NAD at this time. 

## 2019-01-21 NOTE — ED Notes (Signed)
Pt to ED with c/o of "severe" lower back pain. Pt states he woke up in pain and crying but pain is chronic. Pt rates pain at an 8. Pt states pain increases with standing and bending. Pt ambulatory to ED Flex room without difficulty.

## 2019-01-21 NOTE — ED Provider Notes (Signed)
[facility] Emergency Department Provider Note   ____________________________________________   First MD Initiated Contact with Patient 01/21/19 1136     (approximate)  I have reviewed the triage vital signs and the nursing notes.   HISTORY  Chief Complaint work note and Back Pain    HPI Dustin Fleming is a 28 y.o. male patient complain of chronic back pain for greater than 5 years.  Patient state he was told that he had a curvature in his lumbar spine.  Patient recently started working in state pain increased with flexion and lifting.  Patient denies radicular component to his back pain.  Patient denies bladder bowel dysfunction.  Patient rates his pain as 9/10.  Patient described the pain as "aching".  No palliative measure for complaint.  Patient requesting referral to orthopedics.         Past Medical History:  Diagnosis Date  . ADHD (attention deficit hyperactivity disorder)   . Bipolar 1 disorder ([place])   . Depression   . Hypertension   . Learning disability   . Murmur   . Seizures ([place])     There are no active problems to display for this patient.   Past Surgical History:  Procedure Laterality Date  . HIP FRACTURE SURGERY     bilateral    Prior to Admission medications   Medication Sig Start Date End Date Taking? Authorizing Provider  cyclobenzaprine (FLEXERIL) 10 MG tablet Take 1 tablet (10 mg total) by mouth 2 (two) times daily as needed for muscle spasms. 09/01/17   Tegeler, Gwenyth Allegra, MD  cyclobenzaprine (FLEXERIL) 10 MG tablet Take 1 tablet (10 mg total) by mouth 3 (three) times daily as needed. 01/21/19   Sable Feil, PA-C  ibuprofen (ADVIL,MOTRIN) 200 MG tablet Take 400 mg by mouth daily as needed for moderate pain.    [provider]  lamoTRIgine (LAMICTAL) 25 MG tablet Take 1 tablet (25 mg total) by mouth daily. 09/01/17   Tegeler, Gwenyth Allegra, MD  meloxicam (MOBIC) 15 MG tablet Take 1 tablet (15 mg total)  by mouth daily. Patient not taking: Reported on 08/31/2017 07/17/17   Robyn Haber, MD  naproxen (NAPROSYN) 500 MG tablet Take 1 tablet (500 mg total) by mouth 2 (two) times daily with a meal. 01/21/19   Sable Feil, PA-C  oxyCODONE-acetaminophen (PERCOCET) 7.5-325 MG tablet Take 1 tablet by mouth every 6 (six) hours as needed. 01/21/19   Sable Feil, PA-C    Allergies Dust mite extract and Mold extract [trichophyton]  No family history on file.  Social History Social History   Tobacco Use  . Smoking status: Current Every Day Smoker    Packs/day: 1.00  Substance Use Topics  . Alcohol use: Yes  . Drug use: Yes    Types: Marijuana    Comment: K2, potpouri    Review of Systems Constitutional: No fever/chills Eyes: No visual changes. ENT: No sore throat. Cardiovascular: Denies chest pain. Respiratory: Denies shortness of breath. Gastrointestinal: No abdominal pain.  No nausea, no vomiting.  No diarrhea.  No constipation. Genitourinary: Negative for dysuria. Musculoskeletal: Positive for back pain. Skin: Negative for rash. Neurological: Negative for headaches, focal weakness or numbness. Psychiatric:  ADHD, bipolar, and depression. Endocrine:  Hypertension. Allergic/Immunilogical: Mold and dust mites.  ____________________________________________   PHYSICAL EXAM:  VITAL SIGNS: ED Triage Vitals  Enc Vitals Group     BP 01/21/19 1033 135/84     Pulse Rate 01/21/19 1033 78  Resp 01/21/19 1033 18     Temp 01/21/19 1033 98.1 F (36.7 C)     Temp Source 01/21/19 1033 Oral     SpO2 01/21/19 1033 96 %     Weight 01/21/19 1030 300 lb (136.1 kg)     Height 01/21/19 1030 6\' 7"  (2.007 m)     Head Circumference --      Peak Flow --      Pain Score 01/21/19 1030 9     Pain Loc --      Pain Edu? --      Excl. in GC? --     Constitutional: Alert and oriented. Well appearing and in no acute distress.  Morbid obesity. Cardiovascular: Normal rate, regular rhythm.  Grossly normal heart sounds.  Good peripheral circulation. Respiratory: Normal respiratory effort.  No retractions. Lungs CTAB. Musculoskeletal: Body habitus limits the exam.  No obvious spinal deformity.  Patient has moderate guarding palpation of L4-S1.  Patient decreased range of motion with flexion.  Patient had paraspinal muscle spasm returning to the erect position.  Neurologic:  Normal speech and language. No gross focal neurologic deficits are appreciated. No gait instability. Skin:  Skin is warm, dry and intact. No rash noted. Psychiatric: Mood and affect are normal. Speech and behavior are normal.  ____________________________________________   LABS (all labs ordered are listed, but only abnormal results are displayed)  Labs Reviewed - No data to display ____________________________________________  EKG   ____________________________________________  RADIOLOGY  ED MD interpretation:    Official radiology report(s): Dg Lumbar Spine 2-3 Views  Result Date: 01/21/2019 CLINICAL DATA:  Patient with new onset back pain. EXAM: LUMBAR SPINE - 2-3 VIEW COMPARISON:  None. FINDINGS: Normal anatomic alignment. No evidence for acute fracture or dislocation. Preservation of the vertebral body and intervertebral disc space heights. SI joints unremarkable. IMPRESSION: No acute osseous abnormality. Electronically Signed   By: 13/09/2018 M.D.   On: 01/21/2019 12:31    ____________________________________________   PROCEDURES  Procedure(s) performed (including Critical Care):  Procedures   ____________________________________________   INITIAL IMPRESSION / ASSESSMENT AND PLAN / ED COURSE  As part of my medical decision making, I reviewed the following data within the electronic MEDICAL RECORD NUMBER         Patient states history of chronic back pain is worse in the past 2 days.  Patient state recently started to work last week.  Patient states he was told years ago he has  scoliosis in the lower back.  Discussed x-ray findings with patient showing no acute findings.  Patient given discharge care instruction and will follow orthopedic for definitive evaluation and treatment.      ____________________________________________   FINAL CLINICAL IMPRESSION(S) / ED DIAGNOSES  Final diagnoses:  Chronic bilateral low back pain without sciatica     ED Discharge Orders         Ordered    oxyCODONE-acetaminophen (PERCOCET) 7.5-325 MG tablet  Every 6 hours PRN     01/21/19 1244    cyclobenzaprine (FLEXERIL) 10 MG tablet  3 times daily PRN     01/21/19 1244    naproxen (NAPROSYN) 500 MG tablet  2 times daily with meals     01/21/19 1244           Note:  This document was prepared using Dragon voice recognition software and may include unintentional dictation errors.    13/07/20, PA-C 01/21/19 1248    13/07/20, MD 01/21/19 [phone]

## 2019-01-21 NOTE — ED Triage Notes (Signed)
Pt reports needs a work note for today and a referral to an ortho doctor or someone that can help with his back pain. Pt reports lower back pain and states that someone told him he was developing scoliosis.

## 2019-01-21 NOTE — Discharge Instructions (Addendum)
Follow discharge care instruction take medication as directed.  Call orthopedic clinic Monday morning and tell them you a follow-up in the emergency room so they can schedule an appointment.  Do not take muscle relaxers and pain medication while working or driving.
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
## 2021-09-02 IMAGING — CR DG LUMBAR SPINE 2-3V
1 series · 3 of 3 positions shown · non-contrast
Comparison: None.

CLINICAL DATA: Patient with new onset back pain.

EXAM:
LUMBAR SPINE - 2-3 VIEW

[Series 1: dg lumbar spine 2-3 views · 0.14mm/px · 3 of 3 slices shown]
[im 1/3]
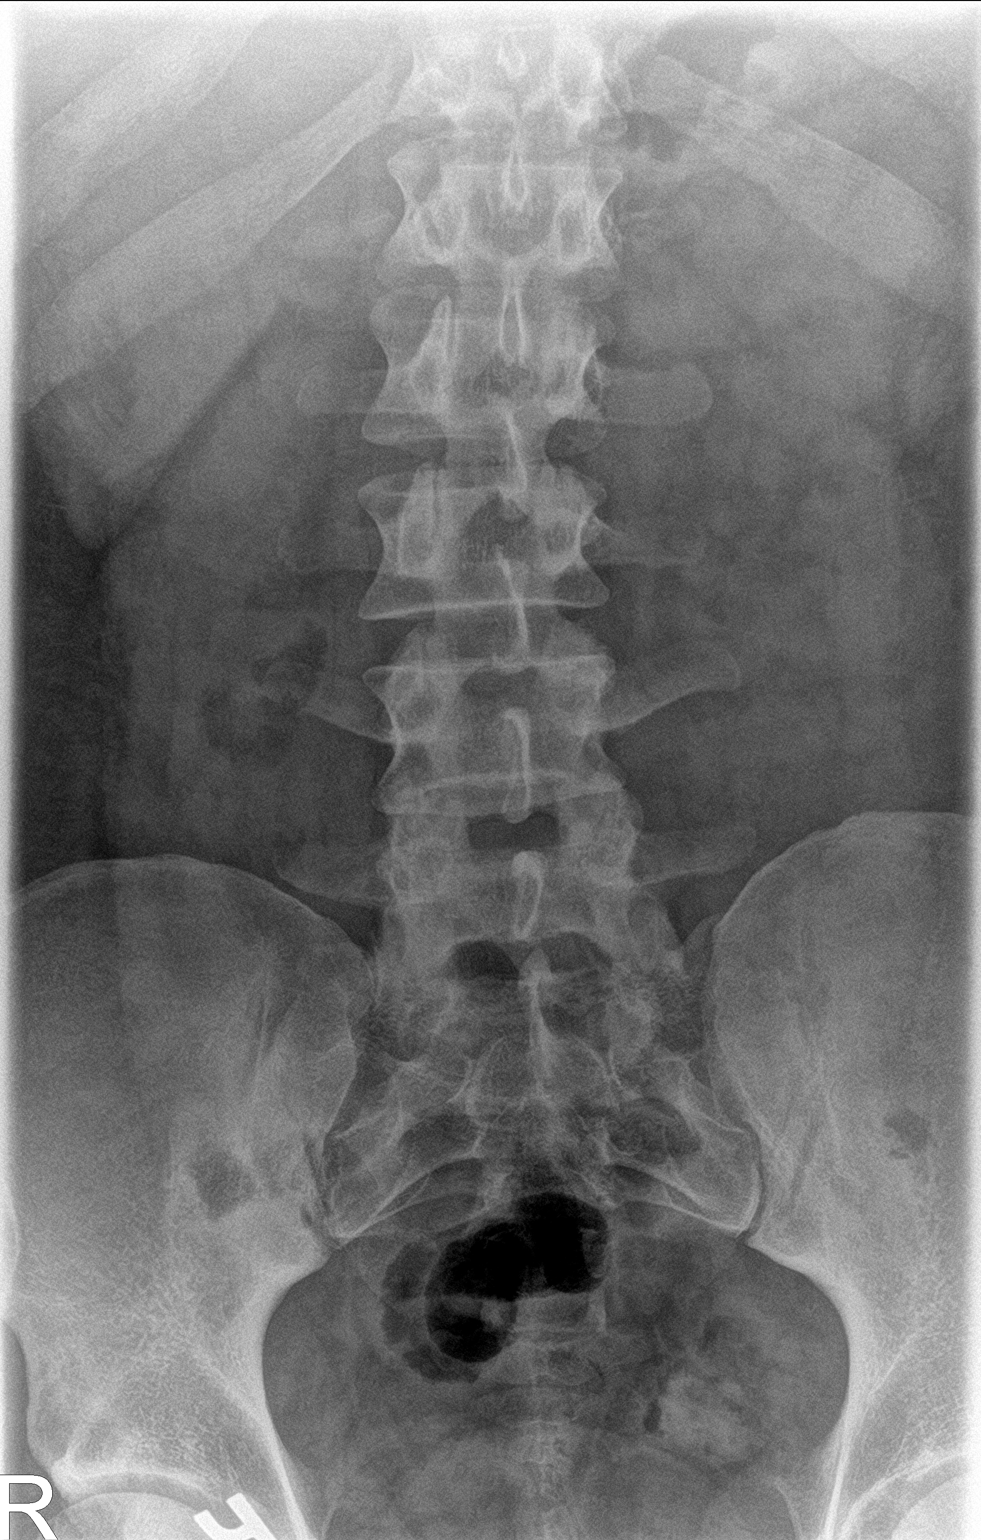
[im 2/3]
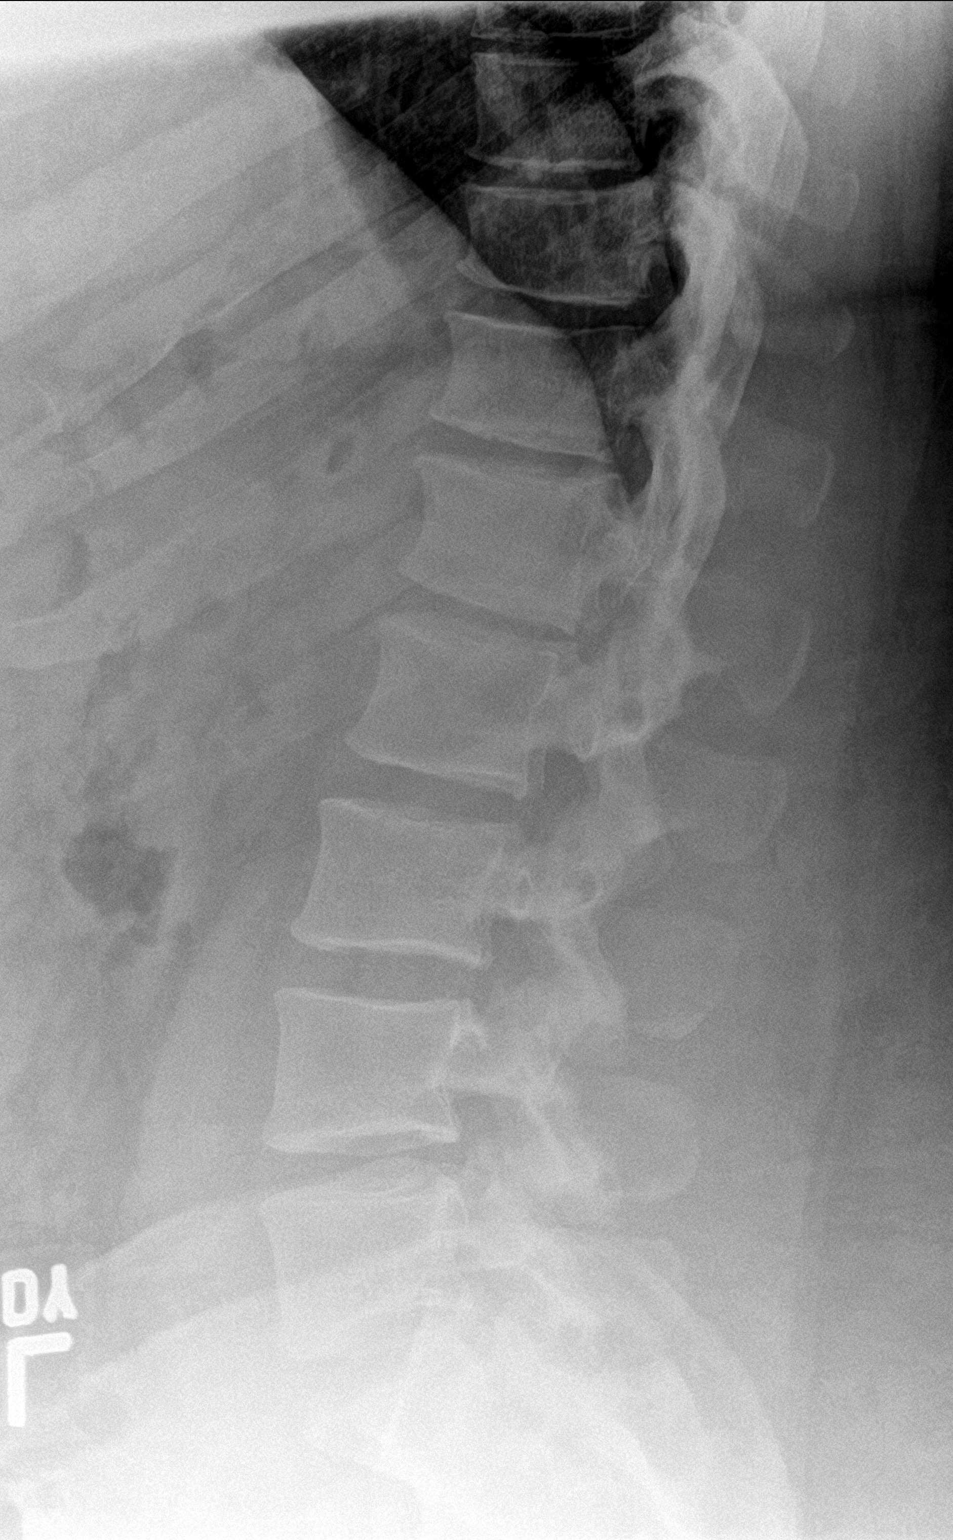
[im 3/3]
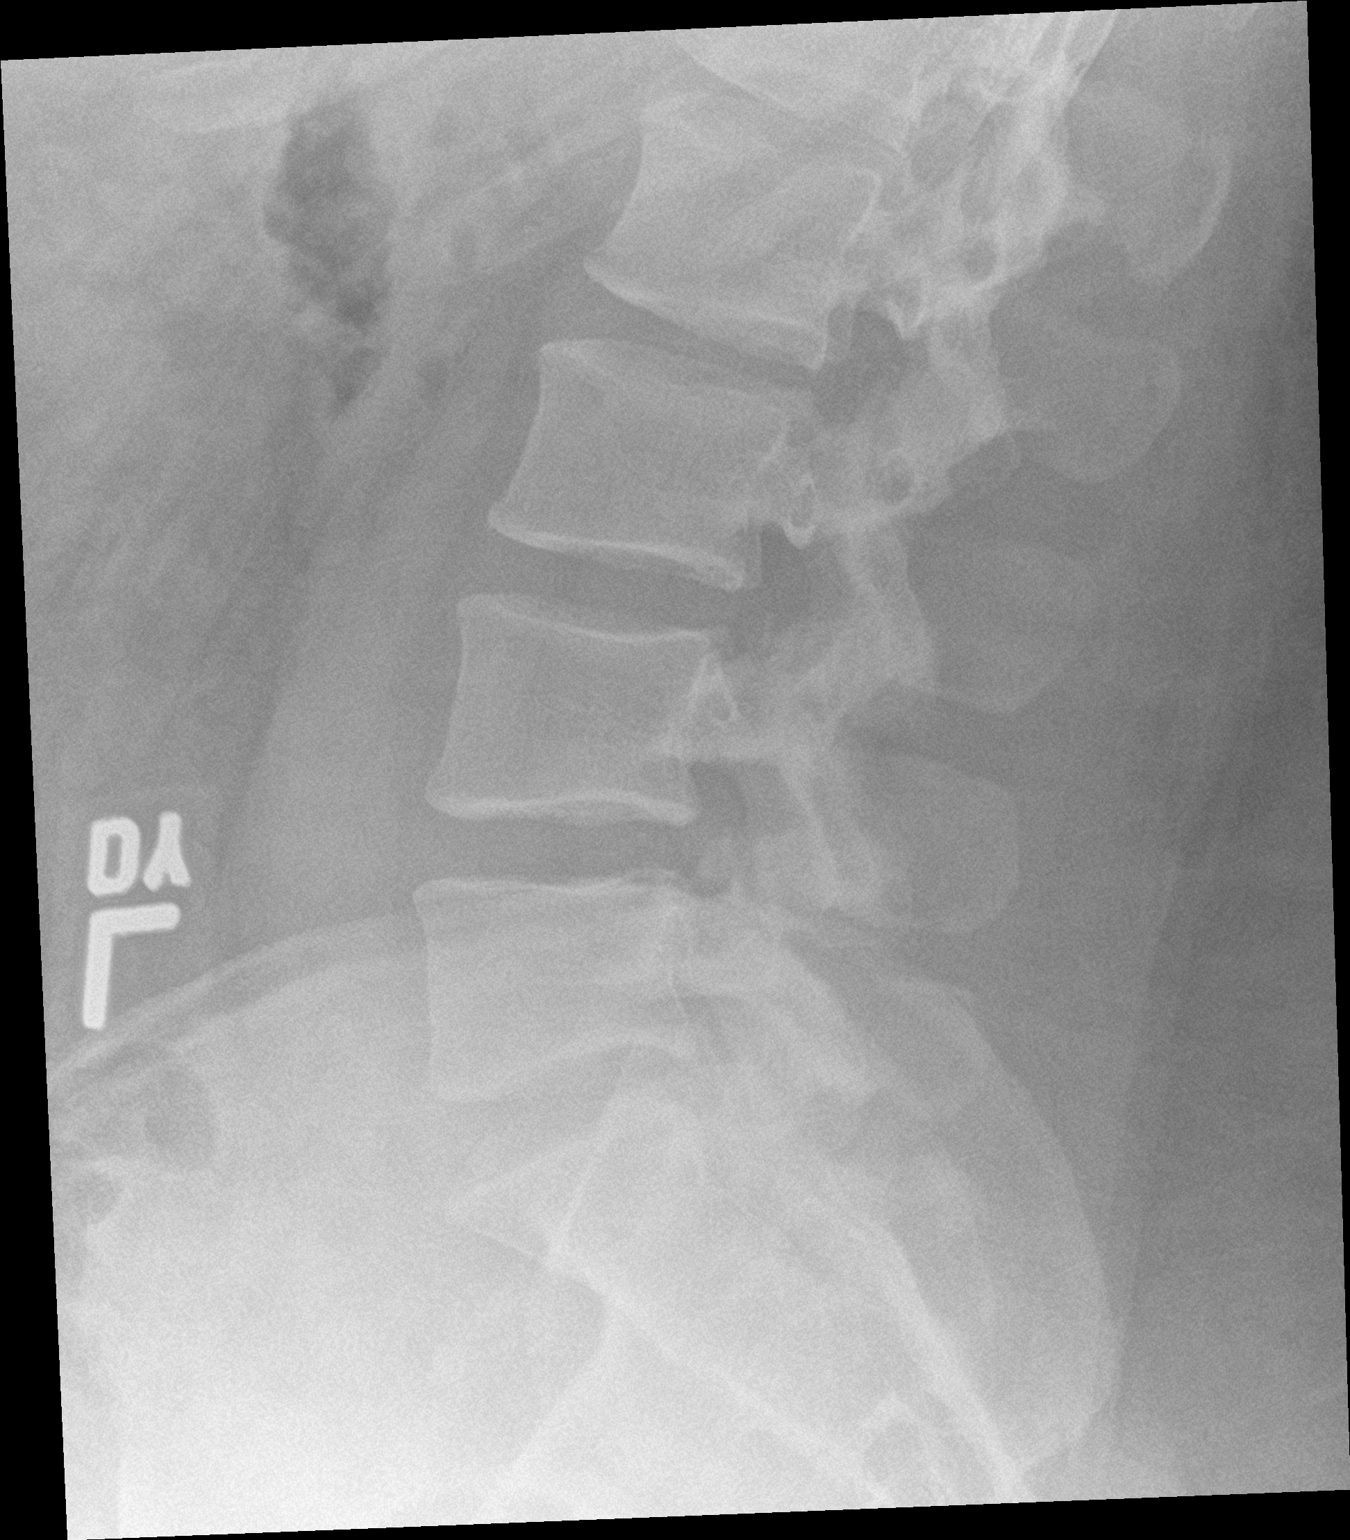

[3 of 3 positions shown; findings below may reference images not displayed]

FINDINGS: Normal anatomic alignment. No evidence for acute fracture or
dislocation. Preservation of the vertebral body and intervertebral
disc space heights. SI joints unremarkable.
IMPRESSION: No acute osseous abnormality.
